# Patient Record
Sex: Female | Born: 2000 | Race: Black or African American | Hispanic: No | Marital: Single | Smoking: Never smoker
Health system: Southern US, Community
[De-identification: ages and names within clinical notes are randomized; demographics above are authoritative.]

## PROBLEM LIST (undated history)

## (undated) DIAGNOSIS — E669 Obesity, unspecified: Secondary | ICD-10-CM

## (undated) HISTORY — DX: Obesity, unspecified: E66.9

---
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
## 2020-01-03 NOTE — Telephone Encounter (Signed)
Unable to contact patient.  Closing encounter.

## 2020-08-20 ENCOUNTER — Other Ambulatory Visit: Payer: Self-pay

## 2020-08-20 ENCOUNTER — Emergency Department (HOSPITAL_COMMUNITY)
Admission: EM | Admit: 2020-08-20 | Discharge: 2020-08-20 | Disposition: A | Discharge status: Home / Self Care | Payer: Medicaid Other | Attending: Emergency Medicine | Admitting: Emergency Medicine

## 2020-08-20 DIAGNOSIS — N3090 Cystitis, unspecified without hematuria: Secondary | ICD-10-CM | POA: Diagnosis not present

## 2020-08-20 DIAGNOSIS — N898 Other specified noninflammatory disorders of vagina: Secondary | ICD-10-CM | POA: Diagnosis present

## 2020-08-20 DIAGNOSIS — Z711 Person with feared health complaint in whom no diagnosis is made: Secondary | ICD-10-CM

## 2020-08-20 DIAGNOSIS — N760 Acute vaginitis: Secondary | ICD-10-CM | POA: Diagnosis not present

## 2020-08-20 DIAGNOSIS — N3000 Acute cystitis without hematuria: Secondary | ICD-10-CM

## 2020-08-20 DIAGNOSIS — B9689 Other specified bacterial agents as the cause of diseases classified elsewhere: Secondary | ICD-10-CM

## 2020-08-20 LAB — URINALYSIS, ROUTINE W REFLEX MICROSCOPIC
Bilirubin Urine: NEGATIVE
Glucose, UA: NEGATIVE mg/dL
Hgb urine dipstick: NEGATIVE
Ketones, ur: NEGATIVE mg/dL
Nitrite: POSITIVE — AB
Protein, ur: NEGATIVE mg/dL
Specific Gravity, Urine: 1.018 (ref 1.005–1.030)
pH: 7 (ref 5.0–8.0)

## 2020-08-20 LAB — WET PREP, GENITAL
Sperm: NONE SEEN
Trich, Wet Prep: NONE SEEN
Yeast Wet Prep HPF POC: NONE SEEN

## 2020-08-20 LAB — PREGNANCY, URINE: Preg Test, Ur: NEGATIVE

## 2020-08-20 MED ORDER — DOXYCYCLINE HYCLATE 100 MG PO TABS
100.0000 mg | ORAL_TABLET | Freq: Once | ORAL | Status: AC
  Administered 2020-08-20: 100 mg via ORAL
  Filled 2020-08-20: qty 1

## 2020-08-20 MED ORDER — CEFTRIAXONE SODIUM 1 G IJ SOLR
500.0000 mg | Freq: Once | INTRAMUSCULAR | Status: AC
  Administered 2020-08-20: 500 mg via INTRAMUSCULAR
  Filled 2020-08-20: qty 10

## 2020-08-20 MED ORDER — DOXYCYCLINE HYCLATE 100 MG PO CAPS: 100.0000 mg | ORAL_CAPSULE | Freq: Two times a day (BID) | ORAL | 0 refills | Status: AC

## 2020-08-20 MED ORDER — METRONIDAZOLE 500 MG PO TABS: 500.0000 mg | ORAL_TABLET | Freq: Two times a day (BID) | ORAL | 0 refills | Status: DC

## 2020-08-20 MED ORDER — CEPHALEXIN 500 MG PO CAPS: 500.0000 mg | ORAL_CAPSULE | Freq: Four times a day (QID) | ORAL | 0 refills | Status: AC

## 2020-08-20 MED ORDER — FLUCONAZOLE 200 MG PO TABS: ORAL_TABLET | ORAL | 0 refills | Status: DC

## 2020-08-20 NOTE — Discharge Instructions (Signed)
Take antibiotics as directed. Please take all of your antibiotics until finished.  Take Flagyl as directed.  It is very important that you do not consume any alcohol while taking this medication as it will cause you to become violently ill.  You have been treated today for an STD.   The test results with take 2-3 days to return. If there is an abnormal result, you will be notified. If you do not hear anything, that means the results were negative. You can also log on MyChart to see the results.   Your sexual partner needs to be treated too. Do not have sexual intercourse for the next 7 days and after your partner has been treated.   Follow-up with your primary care doctor in 2-4 days. If you do not have a primary care doctor, you can use one listed in the paperwork.   Return to the Emergency Department for any fever, abdominal pain, difficulty breathing, nausea/vomiting or any other worsening or concerning symptoms.

## 2020-08-20 NOTE — ED Triage Notes (Signed)
Pt reports possible exposure to STI with itch it vaginal area x 2 days ago

## 2020-08-20 NOTE — ED Provider Notes (Addendum)
[facility]-EMERGENCY DEPT Provider Note   CSN: [id number] Arrival date & time: 08/20/20  1244     History Chief Complaint  Patient presents with  . Exposure to STD    Desiree Becker is a 19 y.o. female who presents for evaluation of vaginal irritation and discharge x2 days. She reports that the area has been irritated. She has not noted any sores or lesions. She does shave the vaginal area. She has also noted some while vaginal discharge. She is currently sexually active with 1 partner. They do not use protection. No prior history of STDs. She denies any fevers, abdominal pain, dysuria, hematuria.   Patient has aged out of DSS care.   The history is provided by the patient.       Past Medical History:  Diagnosis Date  . Obesity     Patient Active Problem List   Diagnosis Date Noted  . Acne 11/25/2019  . Poor sleep pattern 01/06/2018  . 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 Pain, dental 07/10/2017  . Mental health disorder 07/10/2017  . Morbid obesity (HCC) 01/25/2014    No past surgical history on file.   OB History   No obstetric history on file.     Family History  Problem Relation Age of Onset  . Diabetes Mother   . Hyperlipidemia Mother   . Hypertension Mother   . Diabetes Sister   . Learning disabilities Sister   . Diabetes Maternal Uncle   . Cancer Maternal Grandmother   . Hypertension Maternal Grandmother   . Heart disease Maternal Grandfather   . Asthma Neg Hx   . Alcohol abuse Neg Hx   . Depression Neg Hx   . Drug abuse Neg Hx   . Early death Neg Hx   . Hearing loss Neg Hx   . Kidney disease Neg Hx   . Mental illness Neg Hx   . Mental retardation Neg Hx   . Vision loss Neg Hx   . Stroke Neg Hx     Social History   Tobacco Use  . Smoking status: Never Smoker  . Smokeless tobacco: Never Used   Substance Use Topics  . Alcohol use: Not on file  . Drug use: Not on file    Home Medications Prior to Admission medications   Medication Sig Start Date End Date Taking? Authorizing Provider  cephALEXin (KEFLEX) 500 MG capsule Take 1 capsule (500 mg total) by mouth 4 (four) times daily for 7 days. 08/20/20 08/27/20  Maxwell Caul, PA-C  Cholecalciferol (VITAMIN D3) 20 MCG (800 UNIT) TABS Take 1 tablet by mouth daily. 12/23/19   Roxy Horseman, MD  doxycycline (VIBRAMYCIN) 100 MG capsule Take 1 capsule (100 mg total) by mouth 2 (two) times daily for 7 days. 08/20/20 08/27/20  Maxwell Caul, PA-C  fluconazole (DIFLUCAN) 200 MG tablet Take one tablet by mouth after finishing all antibiotics 08/20/20   Maxwell Caul, PA-C  ibuprofen (ADVIL,MOTRIN) 800 MG tablet Take 800 mg by mouth every 8 (eight) hours as needed.    [provider]  metroNIDAZOLE (FLAGYL) 500 MG tablet Take 1 tablet (500 mg total) by mouth 2 (two) times daily. 08/20/20   Maxwell Caul, PA-C  norethindrone-ethinyl estradiol-iron (JUNEL FE 1.5/30) 1.5-30 MG-MCG tablet Take 1 tablet by mouth daily. 05/04/19   Roxy Horseman, MD  SAPHRIS 5  MG SUBL 24 hr tablet DISSOLVE 1 TABLET UNDER THE TONGUE AT BEDTIME 07/07/17   [provider]  Vitamin D, Ergocalciferol, (DRISDOL) 1.25 MG (50000 UT) CAPS capsule Take 1 capsule (50,000 Units total) by mouth every 7 (seven) days. 06/23/19   Roxy Horseman, MD    Allergies    Other  Review of Systems   Review of Systems  Constitutional: Negative for fever.  Gastrointestinal: Negative for abdominal pain, nausea and vomiting.  Genitourinary: Positive for vaginal discharge. Negative for dysuria and hematuria.  All other systems reviewed and are negative.   Physical Exam Updated Vital Signs BP (!) 115/56 (BP Location: Left Arm)   Pulse (!) 58   Temp 99.1 F (37.3 C) (Oral)   Resp 16   Ht 5\' 1"  (1.549 m)   Wt 81.6 kg   LMP 07/25/2020 (Approximate)   SpO2  99%   BMI 34.01 kg/m   Physical Exam Vitals and nursing note reviewed.  Constitutional:      Appearance: She is well-developed.     Comments: Sitting comfortably on examination table  HENT:     Head: Normocephalic and atraumatic.  Eyes:     General: No scleral icterus.       Right eye: No discharge.        Left eye: No discharge.     Conjunctiva/sclera: Conjunctivae normal.  Pulmonary:     Effort: Pulmonary effort is normal.  Abdominal:     Comments: Abdomen is soft, non-distended, non-tender. No rigidity, No guarding. No peritoneal signs.  Skin:    General: Skin is warm and dry.  Neurological:     Mental Status: She is alert.  Psychiatric:        Speech: Speech normal.        Behavior: Behavior normal.     ED Results / Procedures / Treatments   Labs (all labs ordered are listed, but only abnormal results are displayed) Labs Reviewed  WET PREP, GENITAL - Abnormal; Notable for the following components:      Result Value   Clue Cells Wet Prep HPF POC PRESENT (*)    WBC, Wet Prep HPF POC MODERATE (*)    All other components within normal limits  URINALYSIS, ROUTINE W REFLEX MICROSCOPIC - Abnormal; Notable for the following components:   Nitrite POSITIVE (*)    Leukocytes,Ua TRACE (*)    Bacteria, UA RARE (*)    Non Squamous Epithelial 11-20 (*)    All other components within normal limits  PREGNANCY, URINE  GC/CHLAMYDIA PROBE AMP ([place]) NOT AT [facility]    EKG None  Radiology No results found.  Procedures Procedures (including critical care time)  Medications Ordered in ED Medications  cefTRIAXone (ROCEPHIN) injection 500 mg (has no administration in time range)  doxycycline (VIBRA-TABS) tablet 100 mg (has no administration in time range)    ED Course  I have reviewed the triage vital signs and the nursing notes.  Pertinent labs & imaging results that were available during my care of the patient were reviewed by me and considered in my medical  decision making (see chart for details).    MDM Rules/Calculators/A&P                          19 year old female who presents for evaluation of vaginal irritation x2 days.  She does shave the area.  She has not noticed any rash or lesions but states it is itchy and irritated.  She also has had some white vaginal discharge.  She is currently sexually active with 1 partner.  He denies protection.  No fevers, abdominal pain, hematuria.  On initial arrival, she is afebrile, nontoxic-appearing.  Vital signs are stable.  On exam, she has no abdominal tenderness.  Urine ordered at triage.  Concern for UTI versus folliculitis versus STD.  History/physical exam concerning for ovarian torsion.  Patient has not had a full pelvic exam before.  I discussed with her regarding doing a full pelvic exam here in the ED versus evaluating the outer vaginal area.  She has already self collected wet prep swab.  I discussed with her the risk versus benefits of both options and after engaging in shared decision-making patient opted to decline full pelvic exam in the ED and does have me evaluate the external vaginal area.  Urine pregnancy is negative.  UA shows positive nitrites, leukocytes, pyuria.  Wet prep shows clue cells.  Patient deferred full pelvic exam here in the ED.  External vaginal area showed no evidence of rash or lesions of would be concerning for folliculitis.  GC/chlamydia added onto urine.  I discussed with patient regarding treatment for STDs.  Patient states she would like to be prophylactically treated.  Patient with no known drug allergies.  Plan to treat her for BV as well as UTI. At this time, patient exhibits no emergent life-threatening condition that require further evaluation in ED or admission. Patient had ample opportunity for questions and discussion. All patient's questions were answered with full understanding.  Portions of this note were generated with Scientist, clinical (histocompatibility and immunogenetics). Dictation  errors may occur despite best attempts at proofreading.  Final Clinical Impression(s) / ED Diagnoses Final diagnoses:  Bacterial vaginosis  Acute cystitis without hematuria  Concern about STD in female without diagnosis    Rx / DC Orders ED Discharge Orders         Ordered    metroNIDAZOLE (FLAGYL) 500 MG tablet  2 times daily        08/20/20 1605    cephALEXin (KEFLEX) 500 MG capsule  4 times daily        08/20/20 1605    doxycycline (VIBRAMYCIN) 100 MG capsule  2 times daily        08/20/20 1605    fluconazole (DIFLUCAN) 200 MG tablet        08/20/20 1605           Maxwell Caul, PA-C 08/20/20 1634    Maxwell Caul, PA-C 08/20/20 1635    Tegeler, Canary Brim, MD 08/20/20 2124

## 2020-08-22 LAB — GC/CHLAMYDIA PROBE AMP (~~LOC~~) NOT AT ARMC
Chlamydia: POSITIVE — AB
Comment: NEGATIVE
Comment: NORMAL
Neisseria Gonorrhea: NEGATIVE

## 2020-10-08 ENCOUNTER — Encounter (HOSPITAL_COMMUNITY): Payer: Self-pay

## 2020-10-08 ENCOUNTER — Other Ambulatory Visit: Payer: Self-pay

## 2020-10-08 ENCOUNTER — Emergency Department (HOSPITAL_COMMUNITY): Payer: Medicaid Other

## 2020-10-08 ENCOUNTER — Emergency Department (HOSPITAL_COMMUNITY)
Admission: EM | Admit: 2020-10-08 | Discharge: 2020-10-08 | Disposition: A | Discharge status: Home / Self Care | Payer: Medicaid Other | Attending: Emergency Medicine | Admitting: Emergency Medicine

## 2020-10-08 DIAGNOSIS — M79642 Pain in left hand: Secondary | ICD-10-CM | POA: Diagnosis present

## 2020-10-08 DIAGNOSIS — Z202 Contact with and (suspected) exposure to infections with a predominantly sexual mode of transmission: Secondary | ICD-10-CM | POA: Insufficient documentation

## 2020-10-08 LAB — URINALYSIS, ROUTINE W REFLEX MICROSCOPIC
Bacteria, UA: NONE SEEN
Bilirubin Urine: NEGATIVE
Glucose, UA: NEGATIVE mg/dL
Hgb urine dipstick: NEGATIVE
Ketones, ur: 5 mg/dL — AB
Nitrite: NEGATIVE
Protein, ur: 30 mg/dL — AB
Specific Gravity, Urine: 1.027 (ref 1.005–1.030)
pH: 6 (ref 5.0–8.0)

## 2020-10-08 LAB — WET PREP, GENITAL
Clue Cells Wet Prep HPF POC: NONE SEEN
Sperm: NONE SEEN
Trich, Wet Prep: NONE SEEN
Yeast Wet Prep HPF POC: NONE SEEN

## 2020-10-08 LAB — POC URINE PREG, ED: Preg Test, Ur: NEGATIVE

## 2020-10-08 MED ORDER — CEFTRIAXONE SODIUM 1 G IJ SOLR
500.0000 mg | Freq: Once | INTRAMUSCULAR | Status: AC
  Administered 2020-10-08: 500 mg via INTRAMUSCULAR
  Filled 2020-10-08: qty 10

## 2020-10-08 MED ORDER — AZITHROMYCIN 250 MG PO TABS
1000.0000 mg | ORAL_TABLET | Freq: Once | ORAL | Status: AC
  Administered 2020-10-08: 1000 mg via ORAL
  Filled 2020-10-08: qty 4

## 2020-10-08 MED ORDER — NAPROXEN 375 MG PO TABS: 375.0000 mg | ORAL_TABLET | Freq: Two times a day (BID) | ORAL | 0 refills | Status: AC

## 2020-10-08 MED ORDER — STERILE WATER FOR INJECTION IJ SOLN
INTRAMUSCULAR | Status: AC
  Administered 2020-10-08: 10 mL
  Filled 2020-10-08: qty 10

## 2020-10-08 NOTE — ED Provider Notes (Signed)
[facility]-EMERGENCY DEPT Provider Note   CSN: [id number] Arrival date & time: 10/08/20  1217     History Chief Complaint  Patient presents with  . SEXUALLY TRANSMITTED DISEASE  . Hand Pain    Desiree Becker is a 19 y.o. female.  Patient presents the emergency department for evaluation of left hand pain as well as STD check.  Patient states that her partner has had symptoms concerning for STI.  Patient tested positive for chlamydia a month ago and was treated.  She currently is asymptomatic denies vaginal bleeding or discharge.  Patient also complains of ring finger pain on her left hand over the past 2 weeks.  Pain is located near the DIP joint.  Worse with palpation.  She has had some swelling.  She denies injuries.        Past Medical History:  Diagnosis Date  . Obesity     Patient Active Problem List   Diagnosis Date Noted  . Acne 11/25/2019  . Poor sleep pattern 01/06/2018  . 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 Pain, dental 07/10/2017  . Mental health disorder 07/10/2017  . Morbid obesity (HCC) 01/25/2014    History reviewed. No pertinent surgical history.   OB History   No obstetric history on file.     Family History  Problem Relation Age of Onset  . Diabetes Mother   . Hyperlipidemia Mother   . Hypertension Mother   . Diabetes Sister   . Learning disabilities Sister   . Diabetes Maternal Uncle   . Cancer Maternal Grandmother   . Hypertension Maternal Grandmother   . Heart disease Maternal Grandfather   . Asthma Neg Hx   . Alcohol abuse Neg Hx   . Depression Neg Hx   . Drug abuse Neg Hx   . Early death Neg Hx   . Hearing loss Neg Hx   . Kidney disease Neg Hx   . Mental illness Neg Hx   . Mental retardation Neg Hx   . Vision loss Neg Hx   . Stroke Neg Hx     Social History   Tobacco Use  . Smoking  status: Never Smoker  . Smokeless tobacco: Never Used  Substance Use Topics  . Alcohol use: Not on file  . Drug use: Not on file    Home Medications Prior to Admission medications   Medication Sig Start Date End Date Taking? Authorizing Provider  Cholecalciferol (VITAMIN D3) 20 MCG (800 UNIT) TABS Take 1 tablet by mouth daily. 12/23/19   Roxy Horseman, MD  fluconazole (DIFLUCAN) 200 MG tablet Take one tablet by mouth after finishing all antibiotics 08/20/20   Maxwell Caul, PA-C  ibuprofen (ADVIL,MOTRIN) 800 MG tablet Take 800 mg by mouth every 8 (eight) hours as needed.    [provider]  metroNIDAZOLE (FLAGYL) 500 MG tablet Take 1 tablet (500 mg total) by mouth 2 (two) times daily. 08/20/20   Maxwell Caul, PA-C  norethindrone-ethinyl estradiol-iron (JUNEL FE 1.5/30) 1.5-30 MG-MCG tablet Take 1 tablet by mouth daily. 05/04/19   Roxy Horseman, MD  SAPHRIS 5 MG SUBL 24 hr tablet DISSOLVE 1 TABLET UNDER THE TONGUE AT BEDTIME 07/07/17   [provider]  Vitamin D, Ergocalciferol, (DRISDOL) 1.25 MG (50000 UT) CAPS capsule Take 1 capsule (50,000 Units total) by mouth every 7 (seven) days. 06/23/19   Ave Filter,  Melanee Spry, MD    Allergies    Other  Review of Systems   Review of Systems  Constitutional: Negative for fever.  Gastrointestinal: Negative for nausea and vomiting.  Genitourinary: Negative for dysuria, frequency, hematuria, urgency, vaginal discharge and vaginal pain.  Musculoskeletal: Positive for arthralgias and joint swelling. Negative for myalgias.  Skin: Negative for rash.  Neurological: Negative for headaches.    Physical Exam Updated Vital Signs BP 127/75 (BP Location: Left Arm)   Pulse 84   Temp 99.3 F (37.4 C) (Oral)   Resp 16   SpO2 100%   Physical Exam Vitals and nursing note reviewed. Exam conducted with a chaperone present.  Constitutional:      Appearance: She is well-developed.  HENT:     Head: Normocephalic and atraumatic.   Eyes:     Conjunctiva/sclera: Conjunctivae normal.  Pulmonary:     Effort: No respiratory distress.  Abdominal:     Tenderness: There is no abdominal tenderness. There is no guarding or rebound.  Genitourinary:    Labia:        Right: No rash.        Left: No rash.      Vagina: Vaginal discharge (mild, white/yellow) present. No tenderness.     Cervix: No erythema.     Uterus: Not tender.      Adnexa:        Right: No tenderness.         Left: No tenderness.    Musculoskeletal:     Cervical back: Normal range of motion and neck supple.     Comments: L index finger: Normal flexion/extension, tenderness at DIP without significant swelling or erythema.   Skin:    General: Skin is warm and dry.  Neurological:     Mental Status: She is alert.     ED Results / Procedures / Treatments   Labs (all labs ordered are listed, but only abnormal results are displayed) Labs Reviewed  WET PREP, GENITAL - Abnormal; Notable for the following components:      Result Value   WBC, Wet Prep HPF POC FEW (*)    All other components within normal limits  URINALYSIS, ROUTINE W REFLEX MICROSCOPIC - Abnormal; Notable for the following components:   APPearance HAZY (*)    Ketones, ur 5 (*)    Protein, ur 30 (*)    Leukocytes,Ua TRACE (*)    All other components within normal limits  POC URINE PREG, ED  GC/CHLAMYDIA PROBE AMP ([place]) NOT AT [facility]    EKG None  Radiology DG Hand Complete Left  Result Date: 10/08/2020 CLINICAL DATA:  Index finger pain and swelling with indeterminate trauma history EXAM: LEFT HAND - COMPLETE 3+ VIEW COMPARISON:  None. FINDINGS: There is no evidence of fracture or dislocation. There is no evidence of arthropathy or other focal bone abnormality. Soft tissues are unremarkable. IMPRESSION: Negative. Electronically Signed   By: Marnee Spring M.D.   On: 10/08/2020 13:21    Procedures Procedures (including critical care time)  Medications Ordered in  ED Medications - No data to display  ED Course  I have reviewed the triage vital signs and the nursing notes.  Pertinent labs & imaging results that were available during my care of the patient were reviewed by me and considered in my medical decision making (see chart for details).  Patient seen and examined.  X-ray ordered, discussed pelvic exam with patient.   Vital signs reviewed and are as follows:  BP 127/75 (BP Location: Left Arm)   Pulse 84   Temp 99.3 F (37.4 C) (Oral)   Resp 16   LMP 09/29/2020   SpO2 100%   1:47 PM Pelvic performed with RN chaperone.   2:42 PM urine pregnancy and UA are negative.  Wet prep without other signs of infection.  Plan for discharge to home.  Patient has received medication for STI exposure.  Patient counseled on safe sexual practices. Told them that they should not have sexual contact for next 7 days and that they need to inform sexual partners so that they can get tested and treated as well. Patient verbalizes understanding and agrees with plan.      MDM Rules/Calculators/A&P                          STI exposure: Pelvic exam performed.  STI testing performed.  Treated for gonorrhea and chlamydia.  Left hand pain: Good function of hand.  X-rays negative.  No signs of infection.  Conservative measures indicated at this time.  Final Clinical Impression(s) / ED Diagnoses Final diagnoses:  Exposure to STD  Pain of left hand    Rx / DC Orders ED Discharge Orders    None       Renne Crigler, PA-C 10/08/20 1446    Mancel Bale, MD 10/11/20 [phone]

## 2020-10-08 NOTE — Discharge Instructions (Signed)
Please read and follow all provided instructions.  Your diagnoses today include:  1. Exposure to STD   2. Pain of left hand     Tests performed today include:  Test for gonorrhea and chlamydia.   Wet prep - no yeast or trichomonas infection  X-ray of the hand - no broken bones  Vital signs. See below for your results today.   Medications:  For treatment of gonorrhea: You were treated with a rocephin (shot) today.   For treatment of chlamydia: You were treated with azithromycin (pills) today.  Home care instructions:  Read educational materials contained in this packet and follow any instructions provided.   You should tell your partners about your infection and avoid having sex for one week to allow time for the medicine to work.  Sexually transmitted disease testing also available at:   [facility] [place], [facility]  [address], [place], phone [phone] or [phone]    Monday - Friday, call for an appointment  Return instructions:   Please return to the Emergency Department if you experience worsening symptoms.   Please return if you have any other emergent concerns.  Additional Information:  Your vital signs today were: BP 127/75 (BP Location: Left Arm)    Pulse 84    Temp 99.3 F (37.4 C) (Oral)    Resp 16    LMP 09/29/2020    SpO2 100%  If your blood pressure (BP) was elevated above 135/85 this visit, please have this repeated by your doctor within one month. --------------

## 2020-10-08 NOTE — ED Triage Notes (Signed)
Pt arrived via walk in, c/o STD exposure, wants to be tested. Asymptomatic. Also, c/o left hand pain, primarily left ring finger swelling and painful.

## 2020-10-09 LAB — GC/CHLAMYDIA PROBE AMP (~~LOC~~) NOT AT ARMC
Chlamydia: POSITIVE — AB
Comment: NEGATIVE
Comment: NORMAL
Neisseria Gonorrhea: POSITIVE — AB

## 2020-10-20 ENCOUNTER — Emergency Department (HOSPITAL_COMMUNITY)
Admission: EM | Admit: 2020-10-20 | Discharge: 2020-10-21 | Discharge status: Against Medical Advice | Payer: Medicaid Other | Source: Home / Self Care | Attending: Emergency Medicine | Admitting: Emergency Medicine

## 2020-10-20 ENCOUNTER — Emergency Department (HOSPITAL_COMMUNITY): Payer: Medicaid Other

## 2020-10-20 ENCOUNTER — Encounter (HOSPITAL_COMMUNITY): Payer: Self-pay

## 2020-10-20 ENCOUNTER — Emergency Department (HOSPITAL_COMMUNITY)
Admission: EM | Admit: 2020-10-20 | Discharge: 2020-10-20 | Disposition: A | Discharge status: Home / Self Care | Payer: Medicaid Other | Attending: Emergency Medicine | Admitting: Emergency Medicine

## 2020-10-20 ENCOUNTER — Other Ambulatory Visit: Payer: Self-pay

## 2020-10-20 DIAGNOSIS — Y9259 Other trade areas as the place of occurrence of the external cause: Secondary | ICD-10-CM | POA: Diagnosis not present

## 2020-10-20 DIAGNOSIS — W19XXXA Unspecified fall, initial encounter: Secondary | ICD-10-CM

## 2020-10-20 DIAGNOSIS — M79602 Pain in left arm: Secondary | ICD-10-CM | POA: Insufficient documentation

## 2020-10-20 DIAGNOSIS — S51812A Laceration without foreign body of left forearm, initial encounter: Secondary | ICD-10-CM | POA: Insufficient documentation

## 2020-10-20 DIAGNOSIS — M545 Low back pain, unspecified: Secondary | ICD-10-CM | POA: Diagnosis not present

## 2020-10-20 DIAGNOSIS — W01198A Fall on same level from slipping, tripping and stumbling with subsequent striking against other object, initial encounter: Secondary | ICD-10-CM | POA: Insufficient documentation

## 2020-10-20 DIAGNOSIS — M25561 Pain in right knee: Secondary | ICD-10-CM | POA: Diagnosis not present

## 2020-10-20 DIAGNOSIS — S51812D Laceration without foreign body of left forearm, subsequent encounter: Secondary | ICD-10-CM | POA: Insufficient documentation

## 2020-10-20 DIAGNOSIS — Y999 Unspecified external cause status: Secondary | ICD-10-CM | POA: Insufficient documentation

## 2020-10-20 DIAGNOSIS — W19XXXD Unspecified fall, subsequent encounter: Secondary | ICD-10-CM | POA: Insufficient documentation

## 2020-10-20 DIAGNOSIS — Y939 Activity, unspecified: Secondary | ICD-10-CM | POA: Diagnosis not present

## 2020-10-20 DIAGNOSIS — Z23 Encounter for immunization: Secondary | ICD-10-CM | POA: Diagnosis not present

## 2020-10-20 LAB — I-STAT BETA HCG BLOOD, ED (MC, WL, AP ONLY): I-stat hCG, quantitative: <5 m[IU]/mL (ref <5)

## 2020-10-20 MED ORDER — KETOROLAC TROMETHAMINE 30 MG/ML IJ SOLN
30.0000 mg | Freq: Once | INTRAMUSCULAR | Status: DC
  Filled 2020-10-20: qty 1

## 2020-10-20 MED ORDER — TETANUS-DIPHTH-ACELL PERTUSSIS 5-2.5-18.5 LF-MCG/0.5 IM SUSY
0.5000 mL | PREFILLED_SYRINGE | Freq: Once | INTRAMUSCULAR | Status: AC
  Administered 2020-10-20: 0.5 mL via INTRAMUSCULAR
  Filled 2020-10-20: qty 0.5

## 2020-10-20 MED ORDER — LIDOCAINE HCL (PF) 1 % IJ SOLN
30.0000 mL | Freq: Once | INTRAMUSCULAR | Status: AC
  Administered 2020-10-20: 30 mL
  Filled 2020-10-20: qty 30

## 2020-10-20 MED ORDER — BACITRACIN ZINC 500 UNIT/GM EX OINT
TOPICAL_OINTMENT | Freq: Once | CUTANEOUS | Status: AC
  Administered 2020-10-20: 1 via TOPICAL
  Filled 2020-10-20: qty 0.9

## 2020-10-20 MED ORDER — KETOROLAC TROMETHAMINE 60 MG/2ML IM SOLN
30.0000 mg | Freq: Once | INTRAMUSCULAR | Status: AC
  Administered 2020-10-20: 30 mg via INTRAMUSCULAR

## 2020-10-20 NOTE — ED Notes (Signed)
Pt called x 3  No answer. 

## 2020-10-20 NOTE — ED Provider Notes (Signed)
[facility]-EMERGENCY DEPT Provider Note   CSN: [id number] Arrival date & time: 10/20/20  1351     History Chief Complaint  Patient presents with  . Fall    Desiree Becker is a 19 y.o. female with pertinent past medical history of obesity that presents emergency department today for fall and evaluation of her knee and back.  Patient states that she was at a hotel this morning and fell against a window and braced onto her left forearm.  Does have a laceration in that area.  Unsure when last tetanus was.  States that her right knee and back have been hurting for the past couple of days prior to the fall, no trauma to either of these areas.  Has not seen anyone for this.  Has not taken anything for this.  Pain in regards to her knee does not radiate anywhere, is able to walk normally, no fevers, chills.  It is mild.  In regards to her back, no dysuria, hematuria, abdominal pain, chest pain, shortness of breath, nausea, vomiting, numbness, tingling, urinary incontinence, bowel incontinence, saddle paresthesias, IV drug use.  Has not fallen.  States that she was in normal health before this.  Patient did not hit her head when she fell today, no loss of consciousness.  No neck pain, vision changes, confusion.  No other complaints, states that she is mainly here for laceration repair.  HPI     Past Medical History:  Diagnosis Date  . Obesity     Patient Active Problem List   Diagnosis Date Noted  . Acne 11/25/2019  . Poor sleep pattern 01/06/2018  . 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 Pain, dental 07/10/2017  . Mental health disorder 07/10/2017  . Morbid obesity (HCC) 01/25/2014    History reviewed. No pertinent surgical history.   OB History   No obstetric history on file.     Family History  Problem Relation Age of Onset  . Diabetes Mother    . Hyperlipidemia Mother   . Hypertension Mother   . Diabetes Sister   . Learning disabilities Sister   . Diabetes Maternal Uncle   . Cancer Maternal Grandmother   . Hypertension Maternal Grandmother   . Heart disease Maternal Grandfather   . Asthma Neg Hx   . Alcohol abuse Neg Hx   . Depression Neg Hx   . Drug abuse Neg Hx   . Early death Neg Hx   . Hearing loss Neg Hx   . Kidney disease Neg Hx   . Mental illness Neg Hx   . Mental retardation Neg Hx   . Vision loss Neg Hx   . Stroke Neg Hx     Social History   Tobacco Use  . Smoking status: Never Smoker  . Smokeless tobacco: Never Used  Substance Use Topics  . Alcohol use: Not on file  . Drug use: Not on file    Home Medications Prior to Admission medications   Medication Sig Start Date End Date Taking? Authorizing Provider  naproxen (NAPROSYN) 375 MG tablet Take 1 tablet (375 mg total) by mouth 2 (two) times daily. 10/08/20   Renne Crigler, PA-C    Allergies    Other  Review of Systems   Review of Systems  Constitutional: Negative for diaphoresis, fatigue and fever.  Eyes: Negative for visual disturbance.  Respiratory: Negative for shortness  of breath.   Cardiovascular: Negative for chest pain.  Gastrointestinal: Negative for nausea and vomiting.  Musculoskeletal: Positive for arthralgias and back pain. Negative for myalgias.  Skin: Negative for color change, pallor, rash and wound.  Neurological: Negative for syncope, weakness, light-headedness, numbness and headaches.  Psychiatric/Behavioral: Negative for behavioral problems and confusion.    Physical Exam Updated Vital Signs BP 133/73 (BP Location: Right Arm)   Pulse 93   Temp 98.9 F (37.2 C) (Oral)   Resp 16   Ht 5\' 1"  (1.549 m)   Wt 76.7 kg   LMP 09/29/2020   SpO2 98%   BMI 31.93 kg/m   Physical Exam Constitutional:      General: She is not in acute distress.    Appearance: Normal appearance. She is not ill-appearing, toxic-appearing or  diaphoretic.  HENT:     Mouth/Throat:     Mouth: Mucous membranes are moist.     Pharynx: Oropharynx is clear.  Eyes:     General: No scleral icterus.    Extraocular Movements: Extraocular movements intact.     Pupils: Pupils are equal, round, and reactive to light.  Cardiovascular:     Rate and Rhythm: Normal rate and regular rhythm.     Pulses: Normal pulses.     Heart sounds: Normal heart sounds.  Pulmonary:     Effort: Pulmonary effort is normal. No respiratory distress.     Breath sounds: Normal breath sounds. No stridor. No wheezing, rhonchi or rales.  Chest:     Chest wall: No tenderness.  Abdominal:     General: Abdomen is flat. There is no distension.     Palpations: Abdomen is soft.     Tenderness: There is no abdominal tenderness. There is no guarding or rebound.  Musculoskeletal:        General: No swelling or tenderness. Normal range of motion.     Cervical back: Normal range of motion and neck supple. No rigidity.     Right lower leg: No edema.     Left lower leg: No edema.     Comments: No cervical, thoracic or lumbar midline tenderness, no tenderness to palpation of entire back.  No ecchymosis or erythema.  Normal range of motion to back.  No objective numbness to back or groin.  Left arm with 10 cm laceration to mid forearm, bleeding has been controlled.  Normal range of motion to forearm, wrist, elbow, fingers.  No tenderness to palpation of elbow or wrist.  Normal cap refill, radial pulse 2+.  Knee on right side without any erythema or warmth, tenderness to palpation of patella.  PT pulses 2+, normal strength and sensation to bilateral lower extremities.    Skin:    General: Skin is warm and dry.     Capillary Refill: Capillary refill takes less than 2 seconds.     Coloration: Skin is not pale.  Neurological:     General: No focal deficit present.     Mental Status: She is alert and oriented to person, place, and time.     Cranial Nerves: No cranial nerve  deficit.     Sensory: No sensory deficit.     Motor: No weakness.     Coordination: Coordination normal.     Gait: Gait normal.  Psychiatric:        Mood and Affect: Mood normal.        Behavior: Behavior normal.     ED Results / Procedures / Treatments   Labs (  all labs ordered are listed, but only abnormal results are displayed) Labs Reviewed  I-STAT BETA HCG BLOOD, ED ([facility], [facility], [facility] ONLY)    EKG None  Radiology DG Forearm Left  Result Date: 10/20/2020 CLINICAL DATA:  Status post fall, broken glass. EXAM: LEFT FOREARM - 2 VIEW COMPARISON:  None. FINDINGS: There is no evidence of fracture or other focal bone lesions. A 1.6 cm soft tissue defect along the dorsal aspect of the mid left forearm with no associated retained radiopaque foreign body. IMPRESSION: 1. A 1.6 cm soft tissue defect along the dorsal aspect of the mid left forearm with no associated retained radiopaque foreign body. 2.  No acute displaced fracture or dislocation. Electronically Signed   By: Tish FredericksonMorgane  Naveau M.D.   On: 10/20/2020 15:31   DG Knee 2 Views Right  Result Date: 10/20/2020 CLINICAL DATA:  Status post fall, broken glass EXAM: RIGHT KNEE - 1-2 VIEW COMPARISON:  None. FINDINGS: No evidence of fracture, dislocation, or joint effusion. No evidence of arthropathy or other focal bone abnormality. Soft tissues are unremarkable. No retained radiopaque foreign body. IMPRESSION: Negative two-view radiograph of the right knee. Electronically Signed   By: Tish FredericksonMorgane  Naveau M.D.   On: 10/20/2020 15:31    Procedures .Marland Kitchen.Laceration Repair  Date/Time: 10/20/2020 4:46 PM Performed by: Farrel GordonPatel, Shivonne Schwartzman, PA-C Authorized by: Farrel GordonPatel, Romana Deaton, PA-C   Consent:    Consent obtained:  Verbal   Consent given by:  Patient   Risks discussed:  Infection, need for additional repair, pain, poor cosmetic result and poor wound healing   Alternatives discussed:  No treatment and delayed treatment Universal protocol:    Procedure explained and  questions answered to patient or proxy's satisfaction: yes     Relevant documents present and verified: yes     Test results available and properly labeled: yes     Imaging studies available: yes     Required blood products, implants, devices, and special equipment available: yes     Site/side marked: yes     Immediately prior to procedure, a time out was called: yes     Patient identity confirmed:  Verbally with patient Anesthesia (see MAR for exact dosages):    Anesthesia method:  Local infiltration   Local anesthetic:  Lidocaine 1% w/o epi Laceration details:    Location:  Shoulder/arm   Shoulder/arm location:  L upper arm   Length (cm):  10   Depth (mm):  3 Repair type:    Repair type:  Simple Pre-procedure details:    Preparation:  Patient was prepped and draped in usual sterile fashion Exploration:    Hemostasis achieved with:  Direct pressure   Wound exploration: wound explored through full range of motion and entire depth of wound probed and visualized     Wound extent: no fascia violation noted, no foreign bodies/material noted, no muscle damage noted, no tendon damage noted, no underlying fracture noted and no vascular damage noted     Contaminated: no   Treatment:    Area cleansed with:  Saline and Betadine   Amount of cleaning:  Extensive   Irrigation solution:  Sterile saline   Irrigation volume:  1000   Irrigation method:  Pressure wash   Visualized foreign bodies/material removed: no   Skin repair:    Repair method:  Sutures   Suture size:  5-0   Suture material:  Prolene   Suture technique:  Simple interrupted   Number of sutures:  12 Approximation:    Approximation:  Close Post-procedure details:    Dressing:  Antibiotic ointment   Patient tolerance of procedure:  Tolerated well, no immediate complications   (including critical care time)  Medications Ordered in ED Medications  bacitracin ointment (has no administration in time range)  Tdap (BOOSTRIX)  injection 0.5 mL (0.5 mLs Intramuscular Given 10/20/20 1517)  lidocaine (PF) (XYLOCAINE) 1 % injection 30 mL (30 mLs Infiltration Given 10/20/20 1515)  ketorolac (TORADOL) injection 30 mg (30 mg Intramuscular Given 10/20/20 1522)    ED Course  I have reviewed the triage vital signs and the nursing notes.  Pertinent labs & imaging results that were available during my care of the patient were reviewed by me and considered in my medical decision making (see chart for details).    MDM Rules/Calculators/A&P                         Raissa Dam is a 19 y.o. female with pertinent past medical history of obesity that presents emergency department today for fall and evaluation of her knee and back.  No concerns for septic joint of the knee, normal range of motion, distally neurovascularly intact.  Normal gait.  Think this is most likely musculoskeletal, will have patient take naproxen and follow-up.  In regards to back no signs of cauda equina, no red flag back symptoms, patient is not tender to palpation on back, therefore do not think we need imaging at this time.  Did discuss this with patient who agrees.  Forearm without any fractures or foreign bodies, patient tolerated laceration repair well.  Tdap updated.  Patient distally neurovascularly intact, symptomatic treatment discussed along with strict return precaution.  Doubt need for further emergent work up at this time. I explained the diagnosis and have given explicit precautions to return to the ER including for any other new or worsening symptoms. The patient understands and accepts the medical plan as it's been dictated and I have answered their questions. Discharge instructions concerning home care and prescriptions have been given. The patient is STABLE and is discharged to home in good condition.   Final Clinical Impression(s) / ED Diagnoses Final diagnoses:  Fall, initial encounter  Acute low back pain without sciatica, unspecified back  pain laterality  Acute pain of right knee    Rx / DC Orders ED Discharge Orders    None       Farrel Gordon, PA-C 10/20/20 1652    Alvira Monday, MD 10/22/20 0006

## 2020-10-20 NOTE — ED Triage Notes (Signed)
Pt presents after a fall that occurred this morning. Pt reports she fell against a hotel window. Pt reports a cut on her left arm, c/o right knee pain, and back pain.

## 2020-10-20 NOTE — Discharge Instructions (Addendum)
WOUND CARE Please return in 7 days to have your stitches/staples removed or sooner if you have concerns.  Keep area clean and dry for 24 hours. Do not remove bandage, if applied.  After 24 hours, remove bandage and wash wound gently with mild soap and warm water. Reapply a new bandage after cleaning wound, if directed.  Continue daily cleansing with soap and water until stitches/staples are removed.  Do not apply any ointments or creams to the wound while stitches/staples are in place, as this may cause delayed healing.  Notify the office if you experience any of the following signs of infection: Swelling, redness, pus drainage, streaking, fever >101.0 F  Notify the office if you experience excessive bleeding that does not stop after 15-20 minutes of constant, firm pressure.   In regards to your knee and back pain, I want you to take naproxen or ibuprofen as directed on the bottle for the next 5 days to help ease the pain, please follow-up with your primary care about this.  If you have any new or worsening concerning symptoms please come back to the emergency department that we spoke about.

## 2020-10-20 NOTE — ED Notes (Signed)
Patient states she no longer has a legal guardian, but did in the past. When asked if she wanted them contacted. She refused. No contact information for a guardian found.

## 2020-10-21 MED ORDER — ACETAMINOPHEN 500 MG PO TABS
1000.0000 mg | ORAL_TABLET | Freq: Once | ORAL | Status: AC
  Administered 2020-10-21: 1000 mg via ORAL
  Filled 2020-10-21: qty 2

## 2020-10-21 NOTE — ED Provider Notes (Signed)
[facility] EMERGENCY DEPARTMENT Provider Note  CSN: [id number] Arrival date & time: 10/20/20 2153  Chief Complaint(s) Arm Pain  HPI Desiree Becker is a 19 y.o. female here for pain related to recent arm injury.  Patient sustained a laceration to the left midforearm after a fall.  Cut arm on glass.  She was seen at [facility] long and had laceration irrigated and closed.  Reports that she is having pain with movement of her wrist as well as palpation.  No other injuries.  No redness.  Bleeding is controlled.  No numbness or tingling.  No other physical complaints.  HPI  Past Medical History Past Medical History:  Diagnosis Date  . Obesity    Patient Active Problem List   Diagnosis Date Noted  . Acne 11/25/2019  . Poor sleep pattern 01/06/2018  . 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 Pain, dental 07/10/2017  . Mental health disorder 07/10/2017  . Morbid obesity (HCC) 01/25/2014   Home Medication(s) Prior to Admission medications   Medication Sig Start Date End Date Taking? Authorizing Provider  naproxen (NAPROSYN) 375 MG tablet Take 1 tablet (375 mg total) by mouth 2 (two) times daily. 10/08/20   Renne Crigler, PA-C                                                                                                                                    Past Surgical History No past surgical history on file. Family History Family History  Problem Relation Age of Onset  . Diabetes Mother   . Hyperlipidemia Mother   . Hypertension Mother   . Diabetes Sister   . Learning disabilities Sister   . Diabetes Maternal Uncle   . Cancer Maternal Grandmother   . Hypertension Maternal Grandmother   . Heart disease Maternal Grandfather   . Asthma Neg Hx   . Alcohol abuse Neg Hx   . Depression Neg Hx   . Drug abuse Neg Hx   . Early death Neg Hx   . Hearing  loss Neg Hx   . Kidney disease Neg Hx   . Mental illness Neg Hx   . Mental retardation Neg Hx   . Vision loss Neg Hx   . Stroke Neg Hx     Social History Social History   Tobacco Use  . Smoking status: Never Smoker  . Smokeless tobacco: Never Used  Substance Use Topics  . Alcohol use: Not on file  . Drug use: Not on file   Allergies Other  Review of Systems Review of Systems All other systems are reviewed and are negative for acute change except as noted in the HPI  Physical Exam Vital Signs  I have reviewed the triage vital signs BP 132/89   Pulse (!) 50   Temp 98.3 F (36.8  C) (Oral)   Resp 16   LMP 09/29/2020   SpO2 100%   Physical Exam Vitals reviewed.  Constitutional:      General: She is not in acute distress.    Appearance: She is well-developed. She is not diaphoretic.  HENT:     Head: Normocephalic and atraumatic.     Right Ear: External ear normal.     Left Ear: External ear normal.     Nose: Nose normal.  Eyes:     General: No scleral icterus.    Conjunctiva/sclera: Conjunctivae normal.  Neck:     Trachea: Phonation normal.  Cardiovascular:     Rate and Rhythm: Normal rate and regular rhythm.  Pulmonary:     Effort: Pulmonary effort is normal. No respiratory distress.     Breath sounds: No stridor.  Abdominal:     General: There is no distension.  Musculoskeletal:        General: Normal range of motion.     Left forearm: Laceration present.       Arms:     Cervical back: Normal range of motion.  Neurological:     Mental Status: She is alert and oriented to person, place, and time.  Psychiatric:        Behavior: Behavior normal.     ED Results and Treatments Labs (all labs ordered are listed, but only abnormal results are displayed) Labs Reviewed - No data to display                                                                                                                       EKG  EKG Interpretation  Date/Time:     Ventricular Rate:    PR Interval:    QRS Duration:   QT Interval:    QTC Calculation:   R Axis:     Text Interpretation:        Radiology DG Forearm Left  Result Date: 10/20/2020 CLINICAL DATA:  Status post fall, broken glass. EXAM: LEFT FOREARM - 2 VIEW COMPARISON:  None. FINDINGS: There is no evidence of fracture or other focal bone lesions. A 1.6 cm soft tissue defect along the dorsal aspect of the mid left forearm with no associated retained radiopaque foreign body. IMPRESSION: 1. A 1.6 cm soft tissue defect along the dorsal aspect of the mid left forearm with no associated retained radiopaque foreign body. 2.  No acute displaced fracture or dislocation. Electronically Signed   By: Tish Frederickson M.D.   On: 10/20/2020 15:31   DG Knee 2 Views Right  Result Date: 10/20/2020 CLINICAL DATA:  Status post fall, broken glass EXAM: RIGHT KNEE - 1-2 VIEW COMPARISON:  None. FINDINGS: No evidence of fracture, dislocation, or joint effusion. No evidence of arthropathy or other focal bone abnormality. Soft tissues are unremarkable. No retained radiopaque foreign body. IMPRESSION: Negative two-view radiograph of the right knee. Electronically Signed   By: Tish Frederickson M.D.   On: 10/20/2020 15:31    Pertinent  labs & imaging results that were available during my care of the patient were reviewed by me and considered in my medical decision making (see chart for details).  Medications Ordered in ED Medications  acetaminophen (TYLENOL) tablet 1,000 mg (has no administration in time range)                                                                                                                                    Procedures Procedures  (including critical care time)  Medical Decision Making / ED Course I have reviewed the nursing notes for this encounter and the patient's prior records (if available in EHR or on provided paperwork).   Sheana Bir was evaluated in Emergency Department  on 10/21/2020 for the symptoms described in the history of present illness. She was evaluated in the context of the global COVID-19 pandemic, which necessitated consideration that the patient might be at risk for infection with the SARS-CoV-2 virus that causes COVID-19. Institutional protocols and algorithms that pertain to the evaluation of patients at risk for COVID-19 are in a state of rapid change based on information released by regulatory bodies including the CDC and federal and state organizations. These policies and algorithms were followed during the patient's care in the ED.  Wound is well-appearing.  No evidence of infection. Patient already had imaging with negative foreign body or underlying fracture. Patient educated on expected symptoms. No need for additional work-up or management. Provided with Tylenol for pain.      Final Clinical Impression(s) / ED Diagnoses Final diagnoses:  Laceration of left forearm, subsequent encounter    The patient appears reasonably screened and/or stabilized for discharge and I doubt any other medical condition or other [facility] requiring further screening, evaluation, or treatment in the ED at this time prior to discharge. Safe for discharge with strict return precautions.  Disposition: Discharge  Condition: Good  I have discussed the results, Dx and Tx plan with the patient/family who expressed understanding and agree(s) with the plan. Discharge instructions discussed at length. The patient/family was given strict return precautions who verbalized understanding of the instructions. No further questions at time of discharge.    ED Discharge Orders    None       Follow Up: Roxy Horseman, MD [address] [place] [address] [phone]  Call  As needed     This chart was dictated using voice recognition software.  Despite best efforts to proofread,  errors can occur which can change the documentation meaning.    Nira Conn, MD 10/21/20 [phone]

## 2020-10-21 NOTE — ED Triage Notes (Signed)
Pt was at [facility] earlier today for lac to left arm.  Pt was sutured and discharged.   Pt here because she st's "it still hurts"  Pt was called several times in lobby before found sleeping

## 2021-02-27 ENCOUNTER — Ambulatory Visit: Payer: Medicaid Other | Admitting: Student in an Organized Health Care Education/Training Program

## 2021-06-17 IMAGING — CR DG FOREARM 2V*L*
2 series · 2 of 2 positions shown · non-contrast
Comparison: None.

CLINICAL DATA: Status post fall, broken glass.

EXAM:
LEFT FOREARM - 2 VIEW

[x forearm ap left]
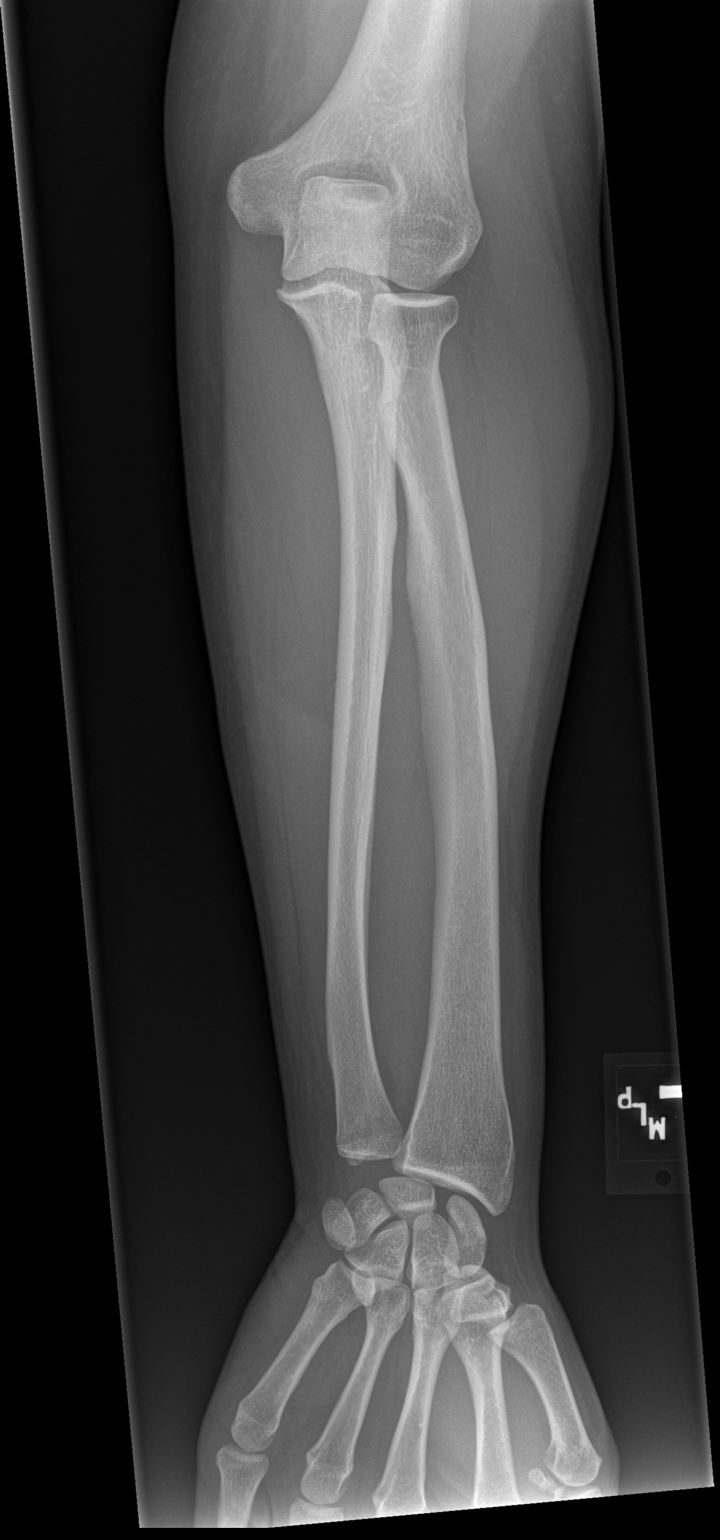

[x forearm lat left]
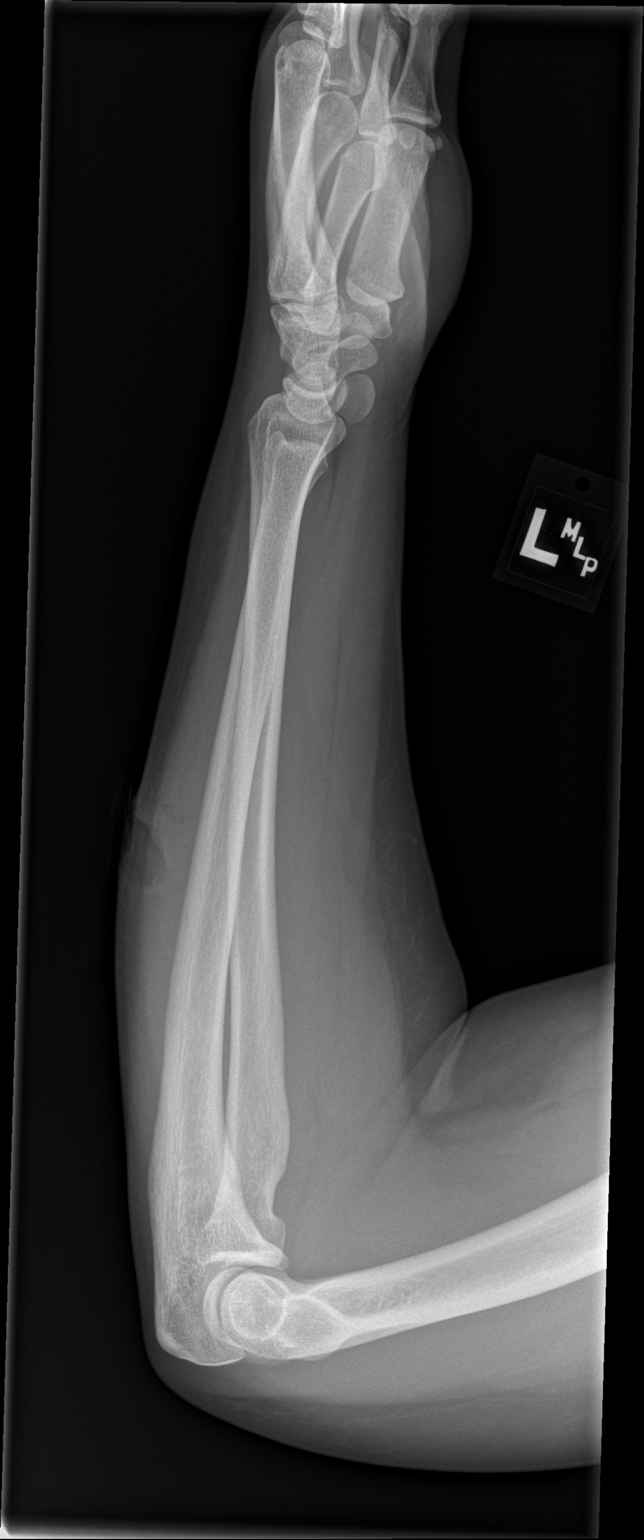

[2 of 2 positions shown; findings below may reference images not displayed]

FINDINGS: There is no evidence of fracture or other focal bone lesions. A
cm soft tissue defect along the dorsal aspect of the mid left
forearm with no associated retained radiopaque foreign body.
IMPRESSION: 1. A 1.6 cm soft tissue defect along the dorsal aspect of the mid
left forearm with no associated retained radiopaque foreign body.
2.  No acute displaced fracture or dislocation.

## 2021-10-25 ENCOUNTER — Encounter (HOSPITAL_COMMUNITY): Payer: Self-pay

## 2021-10-25 ENCOUNTER — Emergency Department (HOSPITAL_COMMUNITY)
Admission: EM | Admit: 2021-10-25 | Discharge: 2021-10-25 | Disposition: A | Discharge status: Home / Self Care | Payer: Medicaid Other | Attending: Emergency Medicine | Admitting: Emergency Medicine

## 2021-10-25 ENCOUNTER — Other Ambulatory Visit: Payer: Self-pay

## 2021-10-25 DIAGNOSIS — L731 Pseudofolliculitis barbae: Secondary | ICD-10-CM | POA: Diagnosis not present

## 2021-10-25 DIAGNOSIS — J101 Influenza due to other identified influenza virus with other respiratory manifestations: Secondary | ICD-10-CM | POA: Insufficient documentation

## 2021-10-25 DIAGNOSIS — Z20822 Contact with and (suspected) exposure to covid-19: Secondary | ICD-10-CM | POA: Insufficient documentation

## 2021-10-25 DIAGNOSIS — R059 Cough, unspecified: Secondary | ICD-10-CM | POA: Diagnosis present

## 2021-10-25 LAB — RESP PANEL BY RT-PCR (FLU A&B, COVID) ARPGX2
Influenza A by PCR: POSITIVE — AB
Influenza B by PCR: NEGATIVE
SARS Coronavirus 2 by RT PCR: NEGATIVE

## 2021-10-25 MED ORDER — PROMETHAZINE-DM 6.25-15 MG/5ML PO SYRP: 5.0000 mL | ORAL_SOLUTION | Freq: Four times a day (QID) | ORAL | 0 refills | Status: AC | PRN

## 2021-10-25 MED ORDER — BENZONATATE 100 MG PO CAPS: 100.0000 mg | ORAL_CAPSULE | Freq: Three times a day (TID) | ORAL | 0 refills | Status: AC

## 2021-10-25 MED ORDER — DOXYCYCLINE HYCLATE 100 MG PO TABS: 100.0000 mg | ORAL_TABLET | Freq: Once | ORAL | Status: DC

## 2021-10-25 MED ORDER — IBUPROFEN 600 MG PO TABS: 600.0000 mg | ORAL_TABLET | Freq: Four times a day (QID) | ORAL | 0 refills | Status: AC | PRN

## 2021-10-25 MED ORDER — DOXYCYCLINE HYCLATE 100 MG PO CAPS: 100.0000 mg | ORAL_CAPSULE | Freq: Two times a day (BID) | ORAL | 0 refills | Status: AC

## 2021-10-25 NOTE — Discharge Instructions (Addendum)
You are diagnosed with influenza A this is a self-limited disease drink plenty of water take Tylenol and ibuprofen as discussed below use the cough medications I prescribed you use Flonase in each nostril 2 sprays twice daily as discussed.  Zyrtec for congestion  Please use Tylenol or ibuprofen for pain.  You may use 600 mg ibuprofen every 6 hours or 1000 mg of Tylenol every 6 hours.  You may choose to alternate between the 2.  This would be most effective.  Not to exceed 4 g of Tylenol within 24 hours.  Not to exceed 3200 mg ibuprofen 24 hours.   You also have small ingrown hair/cyst follow-up with your primary care provider for reassessment of this.  Do sitz baths and warm compresses 4-5 times a day and take antibiotics for full course.   As we discussed these will occasionally require incision and drainage however at this time it does not appear to be infected.  Please call tomorrow morning to make an appointment with your primary care provider.

## 2021-10-25 NOTE — ED Triage Notes (Addendum)
Pt reports with a spider bite to her left upper thigh area x 2 days. Pt states that she feels like she is short of breath.

## 2021-10-26 NOTE — ED Provider Notes (Signed)
[facility] DEPT Provider Note   CSN: [id number] Arrival date & time: 10/25/21  1907     History Chief Complaint  Patient presents with   Insect Bite    Kristle Wojahn is a 20 y.o. female.  HPI Patient is a 20 year old female presented to the ER today with complaints of cough congestion for the past week or so she states that she has no nausea vomiting diarrhea fevers or chills.  She states that her symptoms been persistent and mild.  She states she also has what she thinks is a spider bite to her left side of her vulva she states that this area is swollen and irritated and tender to touch.  She denies any vaginal irritation or vaginal discharge denies any urinary frequency urgency hematuria or dysuria.  Denies any abdominal pain chest pain shortness of breath lightness or dizziness.  She states that this small lump/swollen area left side of her vulva has been present for 2 days does not seem to be worsening.  Denies any redness to this area but states that she has last looked 1 day ago.     Past Medical History:  Diagnosis Date   Obesity     Patient Active Problem List   Diagnosis Date Noted   Acne 11/25/2019   Poor sleep pattern 01/06/2018   Obesity due to excess calories without serious comorbidity with body mass index (BMI) in 95th to 98th percentile for age in pediatric patient 01/06/2018   Pain, dental 07/10/2017   Mental health disorder 07/10/2017   Morbid obesity ([place]) 01/25/2014    History reviewed. No pertinent surgical history.   OB History   No obstetric history on file.     Family History  Problem Relation Age of Onset   Diabetes Mother    Hyperlipidemia Mother    Hypertension Mother    Diabetes Sister    Learning disabilities Sister    Diabetes Maternal Uncle    Cancer Maternal Grandmother    Hypertension Maternal Grandmother    Heart disease Maternal Grandfather    Asthma Neg Hx    Alcohol abuse Neg Hx     Depression Neg Hx    Drug abuse Neg Hx    Early death Neg Hx    Hearing loss Neg Hx    Kidney disease Neg Hx    Mental illness Neg Hx    Mental retardation Neg Hx    Vision loss Neg Hx    Stroke Neg Hx     Social History   Tobacco Use   Smoking status: Never   Smokeless tobacco: Never    Home Medications Prior to Admission medications   Medication Sig Start Date End Date Taking? Authorizing Provider  benzonatate (TESSALON) 100 MG capsule Take 1 capsule (100 mg total) by mouth every 8 (eight) hours. 10/25/21  Yes Cerina Leary, Ova Freshwater S, PA  doxycycline (VIBRAMYCIN) 100 MG capsule Take 1 capsule (100 mg total) by mouth 2 (two) times daily for 7 days. 10/25/21 11/01/21 Yes Marnita Poirier S, PA  ibuprofen (ADVIL) 600 MG tablet Take 1 tablet (600 mg total) by mouth every 6 (six) hours as needed. 10/25/21  Yes Josejulian Tarango S, PA  promethazine-dextromethorphan (PROMETHAZINE-DM) 6.25-15 MG/5ML syrup Take 5 mLs by mouth 4 (four) times daily as needed for cough. 10/25/21  Yes Keylah Darwish S, PA  naproxen (NAPROSYN) 375 MG tablet Take 1 tablet (375 mg total) by mouth 2 (two) times daily. 10/08/20   Carlisle Cater, PA-C  Allergies    Other  Review of Systems   Review of Systems  Constitutional:  Negative for fever.  HENT:  Negative for congestion.   Respiratory:  Positive for cough.   Cardiovascular:  Negative for chest pain.  Gastrointestinal:  Negative for abdominal distention.  Genitourinary:        Boil/spider bite  Neurological:  Negative for dizziness and headaches.   Physical Exam Updated Vital Signs BP 104/84 (BP Location: Right Arm)   Pulse 86   Temp 97.9 F (36.6 C) (Oral)   Resp 20   SpO2 100%   Physical Exam Vitals and nursing note reviewed.  Constitutional:      General: She is not in acute distress. HENT:     Head: Normocephalic and atraumatic.     Nose: Nose normal.  Eyes:     General: No scleral icterus. Cardiovascular:     Rate and Rhythm: Normal rate  and regular rhythm.     Pulses: Normal pulses.     Heart sounds: Normal heart sounds.  Pulmonary:     Effort: Pulmonary effort is normal. No respiratory distress.     Breath sounds: No wheezing.  Abdominal:     Palpations: Abdomen is soft.     Tenderness: There is no abdominal tenderness. There is no guarding or rebound.  Genitourinary:    Comments: W chaperone  Small 1cm circular lump located approximately 3cm away from the outer vulvar margin (left side) Is slightly fluctuant No redness or TTP  Musculoskeletal:     Cervical back: Normal range of motion.     Right lower leg: No edema.     Left lower leg: No edema.  Skin:    General: Skin is warm and dry.     Capillary Refill: Capillary refill takes less than 2 seconds.  Neurological:     Mental Status: She is alert. Mental status is at baseline.  Psychiatric:        Mood and Affect: Mood normal.        Behavior: Behavior normal.    ED Results / Procedures / Treatments   Labs (all labs ordered are listed, but only abnormal results are displayed) Labs Reviewed  RESP PANEL BY RT-PCR (FLU A&B, COVID) ARPGX2 - Abnormal; Notable for the following components:      Result Value   Influenza A by PCR POSITIVE (*)    All other components within normal limits    EKG None  Radiology No results found.  Procedures Procedures   Medications Ordered in ED Medications - No data to display  ED Course  I have reviewed the triage vital signs and the nursing notes.  Pertinent labs & imaging results that were available during my care of the patient were reviewed by me and considered in my medical decision making (see chart for details).  Clinical Course as of 10/26/21 0229  Thu Oct 25, 2021  2321 Influenza A By PCR(!): POSITIVE [WF]    Clinical Course User Index [WF] Tedd Sias, [place]   MDM Rules/Calculators/A&P                          Patient here and diagnosed with influenza A.  Also here with complaints of  lump/boil.  She was initially concerned about a spider bite.  This feels like a ingrown hair.  On my examination she has a noninfected appearing cyst/ingrown hair.  There is no erythema and is not particularly tender  to touch.  I offered incision and drainage versus conservative therapy with sits baths and doxycycline she would prefer the latter.  Discharged home with close follow-up with PCP she will follow-up tomorrow or Monday return precautions given.  She is understanding of plan.  Conservative therapy for influenza including benzonatate ibuprofen Tylenol and Promethazine DM  doxycycline for her boil/ingrown hair.  Samantha Ragen was evaluated in Emergency Department on 10/26/2021 for the symptoms described in the history of present illness. She was evaluated in the context of the global COVID-19 pandemic, which necessitated consideration that the patient might be at risk for infection with the SARS-CoV-2 virus that causes COVID-19. Institutional protocols and algorithms that pertain to the evaluation of patients at risk for COVID-19 are in a state of rapid change based on information released by regulatory bodies including the CDC and federal and state organizations. These policies and algorithms were followed during the patient's care in the ED.   Final Clinical Impression(s) / ED Diagnoses Final diagnoses:  Influenza A  Ingrown hair    Rx / DC Orders ED Discharge Orders          Ordered    doxycycline (VIBRAMYCIN) 100 MG capsule  2 times daily        10/25/21 2318    promethazine-dextromethorphan (PROMETHAZINE-DM) 6.25-15 MG/5ML syrup  4 times daily PRN        10/25/21 2318    benzonatate (TESSALON) 100 MG capsule  Every 8 hours        10/25/21 2318    ibuprofen (ADVIL) 600 MG tablet  Every 6 hours PRN        10/25/21 2318             Gailen Shelter, [place] 10/26/21 0456    Paula Libra, MD 10/26/21 [phone]

## 2021-10-30 ENCOUNTER — Telehealth: Payer: Self-pay

## 2021-10-30 DIAGNOSIS — Z09 Encounter for follow-up examination after completed treatment for conditions other than malignant neoplasm: Secondary | ICD-10-CM

## 2021-10-30 NOTE — Telephone Encounter (Signed)
[facility] sent Adolescent Transition dismissal letter. Pt no-showed last 2 appts, due to being 20 years old needs to transition to adult PCP. Pt can call SWCM if needing assistance choosing or scheduling with adult PCP.  Marked dismissed from [facility]    Kenn File, BSW, [place] Case Manager [facility] [employer] for Child and [facility] Office: [phone] Direct Number: [phone]

## 2021-10-30 NOTE — Telephone Encounter (Signed)
SWCM called pt. Pt was informed of dismissal due to needing to transition to adult PCP. Pt stated clear understanding, and was informed if she needed assistance she could contact [facility] for support with scheduling an adult PCP appt.    Kenn File, BSW, [place] Case Manager [facility] [employer] [facility] Child [facility]olescent [facility] Office: [phone] Direct Number: [phone]

## 2022-04-18 ENCOUNTER — Telehealth: Payer: Medicaid Other | Admitting: Family Medicine

## 2022-04-18 DIAGNOSIS — M549 Dorsalgia, unspecified: Secondary | ICD-10-CM

## 2022-04-18 NOTE — Progress Notes (Signed)
[place]  ? ?Patient has fallen and sustained back pain due to this needs in person assessment. Message sent on this EV  ? ? ?

## 2022-11-25 ENCOUNTER — Emergency Department
Admission: EM | Admit: 2022-11-25 | Discharge: 2022-11-25 | Discharge status: Against Medical Advice | Payer: No Typology Code available for payment source

## 2022-11-25 ENCOUNTER — Encounter: Payer: No Typology Code available for payment source | Admitting: Nurse Practitioner

## 2022-11-25 NOTE — Progress Notes (Signed)
Desiree Becker,   Since we have not heard back from you we are going to close your visit.  If you still need to be seen please reschedule or visit one of our in person locations below:     NOTE: There will be NO CHARGE for this eVisit   If you are having a true medical emergency please call 911.      For an urgent face to face visit, [facility] has seven urgent care centers for your convenience:     [facility] at [facility] Directions [phone] [address] [place], [place] [address]    [facility] Health Urgent Care Center [facility]) Get Driving Directions [phone] [address] [place], [place] [address]  [facility] Health Urgent Care Center [facility]) Get Driving Directions [phone] [address] [place],  [place]  [address]  [facility] Health Urgent Care Center [facility] [employer] Directions  [phone] [address] [place],  [place] [address]   [facility] Health Urgent Care at [facility] Get Driving Directions [phone] [address] [place], [place] [address]   [facility] Health Urgent Care at [facility] Get Driving Directions  [phone] [address] [place], [place] [address]   [facility] Health Urgent Care at [facility] Directions [phone] [address] [place], [place] [address]  Your MyChart E-visit questionnaire answers were reviewed by a board certified advanced clinical practitioner to complete your personal care plan based on your specific symptoms.  Thank you for using e-Visits.

## 2022-11-29 ENCOUNTER — Encounter (HOSPITAL_COMMUNITY): Payer: Self-pay

## 2022-11-29 ENCOUNTER — Other Ambulatory Visit: Payer: Self-pay

## 2022-11-29 ENCOUNTER — Emergency Department (HOSPITAL_COMMUNITY)
Admission: EM | Admit: 2022-11-29 | Discharge: 2022-11-29 | Disposition: A | Discharge status: Home / Self Care | Payer: No Typology Code available for payment source | Attending: Emergency Medicine | Admitting: Emergency Medicine

## 2022-11-29 DIAGNOSIS — R09A9 Foreign body sensation, other site: Secondary | ICD-10-CM

## 2022-11-29 DIAGNOSIS — H938X2 Other specified disorders of left ear: Secondary | ICD-10-CM | POA: Insufficient documentation

## 2022-11-29 DIAGNOSIS — U071 COVID-19: Secondary | ICD-10-CM | POA: Diagnosis not present

## 2022-11-29 LAB — COMPREHENSIVE METABOLIC PANEL
ALT: 18 U/L (ref 0–44)
AST: 22 U/L (ref 15–41)
Albumin: 4 g/dL (ref 3.5–5.0)
Alkaline Phosphatase: 53 U/L (ref 38–126)
Anion gap: 9 (ref 5–15)
BUN: 8 mg/dL (ref 6–20)
CO2: 25 mmol/L (ref 22–32)
Calcium: 10.2 mg/dL (ref 8.9–10.3)
Chloride: 107 mmol/L (ref 98–111)
Creatinine, Ser: 0.73 mg/dL (ref 0.44–1.00)
GFR, Estimated: >60 mL/min (ref >60)
Glucose, Bld: 101 mg/dL — ABNORMAL HIGH (ref 70–99)
Potassium: 3.6 mmol/L (ref 3.5–5.1)
Sodium: 141 mmol/L (ref 135–145)
Total Bilirubin: 0.3 mg/dL (ref 0.3–1.2)
Total Protein: 7.5 g/dL (ref 6.5–8.1)

## 2022-11-29 LAB — RESP PANEL BY RT-PCR (RSV, FLU A&B, COVID)  RVPGX2
Influenza A by PCR: NEGATIVE
Influenza B by PCR: NEGATIVE
Resp Syncytial Virus by PCR: NEGATIVE
SARS Coronavirus 2 by RT PCR: POSITIVE — AB

## 2022-11-29 LAB — CBC
HCT: 38.4 % (ref 36.0–46.0)
Hemoglobin: 12.7 g/dL (ref 12.0–15.0)
MCH: 31.4 pg (ref 26.0–34.0)
MCHC: 33.1 g/dL (ref 30.0–36.0)
MCV: 95 fL (ref 80.0–100.0)
Platelets: 276 10*3/uL (ref 150–400)
RBC: 4.04 MIL/uL (ref 3.87–5.11)
RDW: 12.8 % (ref 11.5–15.5)
WBC: 5.3 10*3/uL (ref 4.0–10.5)
nRBC: 0 % (ref 0.0–0.2)

## 2022-11-29 LAB — I-STAT BETA HCG BLOOD, ED (MC, WL, AP ONLY): I-stat hCG, quantitative: <5 m[IU]/mL (ref <5)

## 2022-11-29 NOTE — ED Provider Triage Note (Addendum)
Emergency Medicine Provider Triage Evaluation Note  Desiree Becker , a 21 y.o. female  was evaluated in triage.  Pt complains of foreign body sensation in her left ear after waking up in a hotel bed and states that she feels that she has spider webs on her neck.  She has not seen any spiders or had any visual hallucinations that she is aware of.  She denies any history of psychiatric disease or kidney disease.  She states that she feels that she has would have itchy abnormal sensation all over her entire body.  She denies any recreational drug use or alcohol use or history of withdrawal from alcohol or other medications/drugs.  She states that she "almost passed out "earlier which prompted her to come to the ER  Review of Systems  Positive: Itching, near syncope, ear pain Negative: Fever   Physical Exam  BP (!) 142/100 (BP Location: Left Arm)   Pulse (!) 115   Temp 98 F (36.7 C) (Oral)   Resp 18   SpO2 100%  Gen:   Awake, no distress   Resp:  Normal effort  MSK:   Moves extremities without difficulty  Other:  Patient is constantly in motion.  Does not appear to be in any acute distress and speaking in full sentences however.  Moves all 4 extremities, smile symmetric, speaking clearly. No FB in either EAC.   Medical Decision Making  Medically screening exam initiated at 5:30 AM.  Appropriate orders placed.  Minerva Bluett was informed that the remainder of the evaluation will be completed by another provider, this initial triage assessment does not replace that evaluation, and the importance of remaining in the ED until their evaluation is complete.  Labs, EKG   Solon Augusta Lathrop, [place] 11/29/22 0533    Gailen Shelter, PA 11/29/22 [phone]

## 2022-11-29 NOTE — Discharge Instructions (Signed)
You are seen in the emergency department for concern of foreign body in your left ear.  Your ear did not show any signs of spider or anything else to explain your symptoms.  You also asked for a COVID test.  This should result in a few hours and you can follow this up in MyChart.  Return to the emergency department if any worsening or concerning symptoms

## 2022-11-29 NOTE — ED Provider Notes (Signed)
[facility]-EMERGENCY DEPT Provider Note   CSN: [id number] Arrival date & time: 11/29/22  6720     History  Chief Complaint  Patient presents with   Foreign Body in Ear    Desiree Becker is a 21 y.o. female.  She is here with a complaint of foreign body sensation in her left ear that started this morning.  She thinks a spider crawled into her ear.  She also is asking for a COVID test.  No chest pain shortness of breath fevers or chills.  No difficulty speaking or swallowing  The history is provided by the patient.  Foreign Body in Ear This is a new problem. The problem occurs constantly. The problem has not changed since onset.Pertinent negatives include no chest pain, no abdominal pain, no headaches and no shortness of breath. Nothing aggravates the symptoms. Nothing relieves the symptoms. She has tried nothing for the symptoms. The treatment provided no relief.       Home Medications Prior to Admission medications   Medication Sig Start Date End Date Taking? Authorizing Provider  benzonatate (TESSALON) 100 MG capsule Take 1 capsule (100 mg total) by mouth every 8 (eight) hours. 10/25/21   Gailen Shelter, PA  ibuprofen (ADVIL) 600 MG tablet Take 1 tablet (600 mg total) by mouth every 6 (six) hours as needed. 10/25/21   Gailen Shelter, PA  naproxen (NAPROSYN) 375 MG tablet Take 1 tablet (375 mg total) by mouth 2 (two) times daily. 10/08/20   Renne Crigler, PA-C  promethazine-dextromethorphan (PROMETHAZINE-DM) 6.25-15 MG/5ML syrup Take 5 mLs by mouth 4 (four) times daily as needed for cough. 10/25/21   Gailen Shelter, PA      Allergies    Other    Review of Systems   Review of Systems  Constitutional:  Negative for fever.  HENT:  Negative for sore throat and trouble swallowing.   Eyes:  Negative for visual disturbance.  Respiratory:  Negative for shortness of breath.   Cardiovascular:  Negative for chest pain.  Gastrointestinal:  Negative for  abdominal pain.  Neurological:  Negative for headaches.    Physical Exam Updated Vital Signs BP (!) 142/100 (BP Location: Left Arm)   Pulse 81   Temp 98 F (36.7 C) (Oral)   Resp 18   SpO2 100%  Physical Exam Vitals and nursing note reviewed.  Constitutional:      General: She is not in acute distress.    Appearance: Normal appearance. She is well-developed.  HENT:     Head: Normocephalic and atraumatic.     Left Ear: Tympanic membrane, ear canal and external ear normal.     Mouth/Throat:     Mouth: Mucous membranes are moist.     Pharynx: Oropharynx is clear.  Eyes:     Conjunctiva/sclera: Conjunctivae normal.  Cardiovascular:     Rate and Rhythm: Normal rate and regular rhythm.     Heart sounds: No murmur heard. Pulmonary:     Effort: Pulmonary effort is normal. No respiratory distress.     Breath sounds: Normal breath sounds. No stridor. No wheezing.  Abdominal:     Palpations: Abdomen is soft.     Tenderness: There is no abdominal tenderness. There is no guarding or rebound.  Musculoskeletal:        General: No tenderness or deformity. Normal range of motion.     Cervical back: Neck supple.  Skin:    General: Skin is warm and dry.  Neurological:  General: No focal deficit present.     Mental Status: She is alert.     GCS: GCS eye subscore is 4. GCS verbal subscore is 5. GCS motor subscore is 6.     ED Results / Procedures / Treatments   Labs (all labs ordered are listed, but only abnormal results are displayed) Labs Reviewed  COMPREHENSIVE METABOLIC PANEL - Abnormal; Notable for the following components:      Result Value   Glucose, Bld 101 (*)    All other components within normal limits  CBC  URINALYSIS, ROUTINE W REFLEX MICROSCOPIC  RAPID URINE DRUG SCREEN, HOSP PERFORMED  I-STAT BETA HCG BLOOD, ED ([facility], [facility], [facility] ONLY)    EKG EKG Interpretation  Date/Time:  Friday November 29 2022 05:36:29 EST Ventricular Rate:  81 PR Interval:  113 QRS  Duration: 82 QT Interval:  341 QTC Calculation: 396 R Axis:   42 Text Interpretation: Sinus rhythm Atrial premature complex Borderline short PR interval Confirmed by Gilda Crease [phone]) on 11/29/2022 5:39:11 AM  Radiology No results found.  Procedures Procedures    Medications Ordered in ED Medications - No data to display  ED Course/ Medical Decision Making/ A&P                           Medical Decision Making  This patient complains of foreign body in ear; this involves an extensive number of treatment Options and is a complaint that carries with it a high risk of complications and morbidity. The differential includes foreign body, psychosomatic, intoxication  I ordered, reviewed and interpreted labs, which included CBC and chemistries unremarkable, incidental COVID-positive after discharge Previous records obtained and reviewed in epic no recent admissions Social determinants considered, no significant barriers Critical Interventions: None  After the interventions stated above, I reevaluated the patient and found patient to be in no distress Admission and further testing considered, no indications for admission or further workup at this time.  Return instructions discussed         Final Clinical Impression(s) / ED Diagnoses Final diagnoses:  Foreign body sensation in left ear canal    Rx / DC Orders ED Discharge Orders     None         Terrilee Files, MD 11/29/22 Rickey Primus

## 2022-11-29 NOTE — ED Notes (Signed)
When presented with discharge paperwork, pt asked "What is making my hair fall out? Whatever was in my ear is making my hair fall out". Pt given reassurance that multiple providers have seen her and found no foreign body in her ear. Pt dissatisfied with answer and stated intent to be seen at [facility]. Pt discharged accordingly.

## 2022-11-29 NOTE — ED Triage Notes (Signed)
Sts she feels like something is in left ear after waking from hotel bed. Suspects a spider as she has spider webs on face.   No fb visualized.

## 2022-12-02 ENCOUNTER — Other Ambulatory Visit: Payer: Self-pay

## 2022-12-02 ENCOUNTER — Emergency Department (HOSPITAL_COMMUNITY)
Admission: EM | Admit: 2022-12-02 | Discharge: 2022-12-02 | Discharge status: Against Medical Advice | Payer: No Typology Code available for payment source | Attending: Emergency Medicine | Admitting: Emergency Medicine

## 2022-12-02 ENCOUNTER — Encounter (HOSPITAL_COMMUNITY): Payer: Self-pay

## 2022-12-02 ENCOUNTER — Emergency Department (HOSPITAL_COMMUNITY): Payer: No Typology Code available for payment source

## 2022-12-02 DIAGNOSIS — Z5321 Procedure and treatment not carried out due to patient leaving prior to being seen by health care provider: Secondary | ICD-10-CM | POA: Diagnosis not present

## 2022-12-02 DIAGNOSIS — R079 Chest pain, unspecified: Secondary | ICD-10-CM | POA: Diagnosis present

## 2022-12-02 DIAGNOSIS — U071 COVID-19: Secondary | ICD-10-CM | POA: Insufficient documentation

## 2022-12-02 NOTE — ED Triage Notes (Signed)
Patient states she is having chest pain, and is Covid +. Patient reports that she was seen 3 days ago. Patient states she has lice in her head and ears. No lice present.

## 2022-12-02 NOTE — ED Notes (Signed)
Pt left. 

## 2022-12-02 NOTE — ED Provider Triage Note (Signed)
Emergency Medicine Provider Triage Evaluation Note  Desiree Becker , a 21 y.o. female  was evaluated in triage.  Pt complains of cp.tested + for covid 2 days ago. Sxs began 5 days ago.Also feels like " I have lice all over my head."  Review of Systems  Positive: cp Negative: fever  Physical Exam  There were no vitals taken for this visit. Gen:   Awake, no distress   Resp:  Normal effort  MSK:   Moves extremities without difficulty  Other:   NO lice.  Medical Decision Making  Medically screening exam initiated at 1:41 PM.  Appropriate orders placed.  Lavonna Lampron was informed that the remainder of the evaluation will be completed by another provider, this initial triage assessment does not replace that evaluation, and the importance of remaining in the ED until their evaluation is complete.     Arthor Captain, PA-C 12/02/22 1344

## 2022-12-05 ENCOUNTER — Other Ambulatory Visit: Payer: Self-pay

## 2022-12-05 ENCOUNTER — Emergency Department (HOSPITAL_COMMUNITY)
Admission: EM | Admit: 2022-12-05 | Discharge: 2022-12-05 | Discharge status: Against Medical Advice | Payer: No Typology Code available for payment source | Attending: Emergency Medicine | Admitting: Emergency Medicine

## 2022-12-05 ENCOUNTER — Encounter (HOSPITAL_COMMUNITY): Payer: Self-pay

## 2022-12-05 DIAGNOSIS — R519 Headache, unspecified: Secondary | ICD-10-CM | POA: Diagnosis not present

## 2022-12-05 DIAGNOSIS — L299 Pruritus, unspecified: Secondary | ICD-10-CM

## 2022-12-05 MED ORDER — PERMETHRIN 5 % EX CREA
TOPICAL_CREAM | CUTANEOUS | 1 refills | Status: AC
Start: 2022-12-05 — End: ?

## 2022-12-05 NOTE — ED Notes (Signed)
Called for pt x2 for bed with no response.

## 2022-12-05 NOTE — Discharge Instructions (Addendum)
Wash all clothing & linens in hot water.  Spray all upholstered surfaces with Nix spray.  Be sure the cream is on the skin for at least 8 hours.  Most people sleep in it over night & wash it off in the morning. You may repeat treatment in 1 week if there is no improvement.

## 2022-12-05 NOTE — ED Provider Triage Note (Signed)
Emergency Medicine Provider Triage Evaluation Note  Desiree Becker , a 21 y.o. female  was evaluated in triage.  Pt complains of headache and pain into her neck.  States it is a "burning" sensation and she feels like there is something in her neck/brain.  Seen in ED recently for same, diagnosed covid + 11/29/22.  Review of Systems  Positive: headache Negative: fever  Physical Exam  BP 129/76 (BP Location: Left Arm)   Pulse 84   Temp 97.8 F (36.6 C)   Resp 14   SpO2 99%   Gen:   Awake, no distress   Resp:  Normal effort  MSK:   Moves extremities without difficulty  Other:  AAOx3, full ROM of neck without rigidity, moving extremities well, no focal deficits  Medical Decision Making  Medically screening exam initiated at 4:16 AM.  Appropriate orders placed.  [name] was informed that the remainder of the evaluation will be completed by another provider, this initial triage assessment does not replace that evaluation, and the importance of remaining in the ED until their evaluation is complete.  Headache, FB sensation.  Seen recently for same.  Also noted to be covid + as of 11/29/22.  No focal deficits, no fever or meningeal signs.   Garlon Hatchet, PA-C 12/05/22 [phone]

## 2022-12-05 NOTE — ED Triage Notes (Addendum)
Pt BIB [facility] from a hotel c/o pain in her head that radiates down into her neck. Pt states this has been an ongoing issues for 2 months. Pt is also COVID positive.  Pt states she feels like something is eating away at her brain.

## 2022-12-05 NOTE — ED Provider Notes (Signed)
[facility] EMERGENCY DEPARTMENT Provider Note   CSN: [id number] Arrival date & time: 12/05/22  0414     History  Chief Complaint  Patient presents with   Neck Pain    Desiree Becker is a 21 y.o. female.  Desiree Becker is a 21 y.o. female  who presents to the emergency department via EMS from hotel complaining of burning and itching in her head and neck.  Patient initially complaining of pain in her head and neck but when asked to describe this further she reports itching causes her to scratch at her scalp and neck then causing her pain.  She reports this has been an ongoing issue for 2 months.  She reports that she currently resides in a hotel and is worried "that she has some sort of bug that has gotten into her brain and is eating her brain."  She denies any visual changes, nausea, vomiting, fevers.  No neck stiffness.  She denies any numbness, weakness or tingling.  She was seen in the ED about a week ago for concern that a bug had crawled into her ear.  At this time she was also found to be COVID-positive.  The history is provided by the patient.  Neck Pain Associated symptoms: headaches   Associated symptoms: no fever        Home Medications Prior to Admission medications   Medication Sig Start Date End Date Taking? Authorizing Provider  permethrin (ELIMITE) 5 % cream Apply cream from head to toe and leave on for at least 8 hours 12/05/22  Yes Dartha Lodge, PA-C  benzonatate (TESSALON) 100 MG capsule Take 1 capsule (100 mg total) by mouth every 8 (eight) hours. 10/25/21   Gailen Shelter, PA  ibuprofen (ADVIL) 600 MG tablet Take 1 tablet (600 mg total) by mouth every 6 (six) hours as needed. 10/25/21   Gailen Shelter, PA  naproxen (NAPROSYN) 375 MG tablet Take 1 tablet (375 mg total) by mouth 2 (two) times daily. 10/08/20   Renne Crigler, PA-C  promethazine-dextromethorphan (PROMETHAZINE-DM) 6.25-15 MG/5ML syrup Take 5 mLs by mouth 4 (four) times  daily as needed for cough. 10/25/21   Gailen Shelter, PA      Allergies    Other    Review of Systems   Review of Systems  Constitutional:  Negative for chills and fever.  Musculoskeletal:  Positive for neck pain. Negative for neck stiffness.  Skin:         pruritus  Neurological:  Positive for headaches.    Physical Exam Updated Vital Signs BP 125/75 (BP Location: Left Arm)   Pulse 81   Temp 98 F (36.7 C) (Oral)   Resp 14   Ht 5\' 2"  (1.575 m)   Wt 61.2 kg   SpO2 100%   BMI 24.69 kg/m  Physical Exam Vitals and nursing note reviewed.  Constitutional:      General: She is not in acute distress.    Appearance: Normal appearance. She is well-developed. She is not ill-appearing or diaphoretic.     Comments: Alert and in no acute distress  HENT:     Head: Normocephalic and atraumatic.     Mouth/Throat:     Mouth: Mucous membranes are moist.     Pharynx: Oropharynx is clear.  Eyes:     General:        Right eye: No discharge.        Left eye: No discharge.     Extraocular  Movements: Extraocular movements intact.     Pupils: Pupils are equal, round, and reactive to light.  Neck:     Comments: No nuchal rigidity, negative Kernig sign Cardiovascular:     Rate and Rhythm: Normal rate and regular rhythm.     Heart sounds: Normal heart sounds.  Pulmonary:     Effort: Pulmonary effort is normal. No respiratory distress.  Musculoskeletal:     Cervical back: No rigidity.  Skin:    General: Skin is warm and dry.     Findings: No rash.     Comments: Some excoriations noted over the scalp neck and extremities but with no notable rash and no insects noted over the skin or scalp.  Neurological:     Mental Status: She is alert and oriented to person, place, and time.     Coordination: Coordination normal.     Comments: Speech is clear, able to follow commands CN III-XII intact Normal strength in upper and lower extremities bilaterally including dorsiflexion and plantar  flexion, strong and equal grip strength Sensation normal to light and sharp touch Moves extremities without ataxia, coordination intact Normal finger to nose and rapid alternating movements No pronator drift  Psychiatric:        Mood and Affect: Mood normal.        Behavior: Behavior normal.     ED Results / Procedures / Treatments   Labs (all labs ordered are listed, but only abnormal results are displayed) Labs Reviewed - No data to display  EKG None  Radiology No results found.  Procedures Procedures    Medications Ordered in ED Medications - No data to display  ED Course/ Medical Decision Making/ A&P                           Medical Decision Making Risk Prescription drug management.   21 year old female presents to the ED complaining of headache, but then after further discussion this seems to be more of a burning and itching sensation.  She expresses concern that a bug is eating away at her.  She was seen for similar on 2/15 when she was concerned that a bug had crawled into her ear, no otic foreign body noted at that encounter but she was diagnosed with COVID-19.  Patient does have some excoriations and is currently residing in a hotel so can certainly be at risk for bedbugs or scabies.  Also concern for some possible delusion although patient does not express any thoughts of harming herself or others and is not responding to internal stimuli.  Do not feel that patient needs emergent psychiatric evaluation.  Out of caution we will treat with permethrin for potential scabies and encourage patient to follow-up if sensation is not improving.  She has no meningeal signs or neurologic deficits therefore I do not feel that lab work or head imaging is indicated at this time.  At this time there does not appear to be any evidence of an acute emergency medical condition requiring further emergent evaluation and the patient appears stable for discharge with appropriate outpatient  follow up. Diagnosis and return precautions discussed with patient who verbalizes understanding and is agreeable to discharge.           Final Clinical Impression(s) / ED Diagnoses Final diagnoses:  Pruritus    Rx / DC Orders ED Discharge Orders          Ordered    permethrin (ELIMITE) 5 %  cream        12/05/22 1501              Dartha Lodge, PA-C 12/26/22 1418    Alvira Monday, MD 12/27/22 1417

## 2022-12-17 ENCOUNTER — Ambulatory Visit: Payer: No Typology Code available for payment source | Admitting: Physician Assistant
